# Patient Record
Sex: Female | Born: 1985 | Hispanic: Yes | Marital: Married | Smoking: Never smoker
Health system: Southern US, Community
[De-identification: ages and names within clinical notes are randomized; demographics above are authoritative.]

## PROBLEM LIST (undated history)

## (undated) DIAGNOSIS — D649 Anemia, unspecified: Secondary | ICD-10-CM

---

## 2008-06-16 ENCOUNTER — Ambulatory Visit: Payer: Self-pay | Admitting: Family Medicine

## 2008-11-09 ENCOUNTER — Ambulatory Visit: Payer: Self-pay | Admitting: Certified Nurse Midwife

## 2008-11-10 ENCOUNTER — Inpatient Hospital Stay: Payer: Self-pay | Admitting: Obstetrics and Gynecology

## 2014-10-13 ENCOUNTER — Emergency Department: Payer: Self-pay | Admitting: Emergency Medicine

## 2014-10-13 LAB — CBC
HCT: 36.4 % (ref 35.0–47.0)
HGB: 11.3 g/dL — ABNORMAL LOW (ref 12.0–16.0)
MCH: 26.2 pg (ref 26.0–34.0)
MCHC: 31.2 g/dL — AB (ref 32.0–36.0)
MCV: 84 fL (ref 80–100)
Platelet: 338 10*3/uL (ref 150–440)
RBC: 4.34 10*6/uL (ref 3.80–5.20)
RDW: 16.6 % — AB (ref 11.5–14.5)
WBC: 9.1 10*3/uL (ref 3.6–11.0)

## 2014-10-13 LAB — HCG, QUANTITATIVE, PREGNANCY: Beta Hcg, Quant.: 1839 m[IU]/mL — ABNORMAL HIGH

## 2015-03-14 LAB — SURGICAL PATHOLOGY

## 2015-07-13 ENCOUNTER — Other Ambulatory Visit: Payer: Self-pay | Admitting: Primary Care

## 2015-07-13 DIAGNOSIS — Z3492 Encounter for supervision of normal pregnancy, unspecified, second trimester: Secondary | ICD-10-CM

## 2015-07-13 LAB — OB RESULTS CONSOLE ABO/RH: RH TYPE: POSITIVE

## 2015-07-13 LAB — OB RESULTS CONSOLE GC/CHLAMYDIA
CHLAMYDIA, DNA PROBE: NEGATIVE
GC PROBE AMP, GENITAL: NEGATIVE

## 2015-07-13 LAB — OB RESULTS CONSOLE RUBELLA ANTIBODY, IGM: Rubella: IMMUNE

## 2015-07-13 LAB — OB RESULTS CONSOLE HIV ANTIBODY (ROUTINE TESTING): HIV: NONREACTIVE

## 2015-07-13 LAB — OB RESULTS CONSOLE ANTIBODY SCREEN: ANTIBODY SCREEN: NEGATIVE

## 2015-07-13 LAB — OB RESULTS CONSOLE HEPATITIS B SURFACE ANTIGEN: Hepatitis B Surface Ag: NEGATIVE

## 2015-07-13 LAB — OB RESULTS CONSOLE VARICELLA ZOSTER ANTIBODY, IGG: Varicella: IMMUNE

## 2015-07-13 LAB — OB RESULTS CONSOLE RPR: RPR: NONREACTIVE

## 2015-08-16 ENCOUNTER — Ambulatory Visit
Admission: RE | Admit: 2015-08-16 | Discharge: 2015-08-16 | Disposition: A | Discharge status: Home / Self Care | Payer: Medicaid Other | Source: Ambulatory Visit | Attending: Primary Care | Admitting: Primary Care

## 2015-08-16 DIAGNOSIS — Z36 Encounter for antenatal screening of mother: Secondary | ICD-10-CM | POA: Insufficient documentation

## 2015-08-16 DIAGNOSIS — Z3492 Encounter for supervision of normal pregnancy, unspecified, second trimester: Secondary | ICD-10-CM

## 2015-11-20 NOTE — L&D Delivery Note (Signed)
Delivery Note At 4:56 PM a viable  female sex was delivered via Vaginal, Spontaneous Delivery (Presentation: ; LOA ) loose nuchal cord .  APGAR: 8, 9; weight  .   Placenta status: , Spontaneous.  Cord: 3 vessels with the following complications:None . Delayed cord clamp for 60 sec .  Marland Kitchen   Anesthesia: None  Episiotomy: None Lacerations: None Suture Repair: n/a Est. Blood Loss (mL):    Mom to postpartum.  Baby to Couplet care / Skin to Skin.  Esha Fincher 01/14/2016, 5:11 PM

## 2015-12-15 LAB — OB RESULTS CONSOLE GBS: STREP GROUP B AG: NEGATIVE

## 2016-01-12 ENCOUNTER — Other Ambulatory Visit: Payer: Self-pay | Admitting: Obstetrics and Gynecology

## 2016-01-12 DIAGNOSIS — O480 Post-term pregnancy: Secondary | ICD-10-CM

## 2016-01-12 NOTE — H&P (Signed)
HISTORY AND PHYSICAL  HISTORY OF PRESENT ILLNESS: Ms. Rhonda Berry is a 30 y.o. G1P0 with LMP of 04/02/16 & EDD of 2/19/17consistent with 20 3/7 week ultrasound with a pregnancy complicated by Overweight status, anemia (Fe def) and previous spont complete AB  presenting for induction of labor for post-dates at 41 weeks. PNC at [facility] with records available.   She has bit been having contractions and denies leakage of fluid, vaginal bleeding, or decreased fetal movement.    REVIEW OF SYSTEMS: A complete review of systems was performed and was specifically negative for headache, changes in vision, RUQ pain, shortness of breath, chest pain, lower extremity edema and dysuria.   HISTORY:  No past medical history on file.  No past surgical history on file.  No current outpatient prescriptions on file prior to visit.   No current facility-administered medications on file prior to visit.     Allergies not on file  OB: G2P0010  Gynecologic History: History of Abnormal Pap Smear: neg 04/27/14 History of STI: none  Social: No ETOH, No MJ or drug history  PHYSICAL EXAM: @  GENERAL: NAD AAOx3 CHEST:CTAB no increased work of breathing CV:RRR no appreciable murmurs, rubs, gallops ABDOMEN: gravid, nontender, EFW g by [place] EXTREMITIES:  Warm and well-perfused, nontender, nonedematous,  DTRs clonus CERVIX: SPECULUM:   FHT:s baseline with  variabilityaccelerations and decelerations  Toco: H&P in advance  DIAGNOSTIC STUDIES: No results for input(s): WBC, HGB, HCT, PLT, NA, K, CL, CO2, BUN, CREATININE, LABGLOM, GLUCOSE, CALCIUM, BILIDIR, ALKPHOS, AST, ALT, PROT, MG in the last 168 hours.  Invalid input(s): LABALB, UA  PRENATAL STUDIES:  PrenO pos Rubella immune, Varicella immune, HIV not found, RPR neg, Hep B neg, GC/CT neg, GBS , glucola 121  Last [place] normal anatomy  ASSESSMENT AND PLAN:  1. Fetal well being reassured 2. IOL scheduled (see Dr Schermerhorn's H&P) P: FU  as scheduled.

## 2016-01-14 ENCOUNTER — Inpatient Hospital Stay
Admission: EM | Admit: 2016-01-14 | Discharge: 2016-01-15 | DRG: 775 | Disposition: A | Discharge status: Home / Self Care | Payer: Medicaid Other | Attending: Obstetrics and Gynecology | Admitting: Obstetrics and Gynecology

## 2016-01-14 DIAGNOSIS — Z3A40 40 weeks gestation of pregnancy: Secondary | ICD-10-CM | POA: Diagnosis not present

## 2016-01-14 DIAGNOSIS — IMO0001 Reserved for inherently not codable concepts without codable children: Secondary | ICD-10-CM

## 2016-01-14 DIAGNOSIS — O480 Post-term pregnancy: Principal | ICD-10-CM | POA: Diagnosis present

## 2016-01-14 HISTORY — DX: Anemia, unspecified: D64.9

## 2016-01-14 LAB — CBC
HCT: 37.3 % (ref 35.0–47.0)
HEMOGLOBIN: 12.8 g/dL (ref 12.0–16.0)
MCH: 31.8 pg (ref 26.0–34.0)
MCHC: 34.4 g/dL (ref 32.0–36.0)
MCV: 92.7 fL (ref 80.0–100.0)
PLATELETS: 271 10*3/uL (ref 150–440)
RBC: 4.02 MIL/uL (ref 3.80–5.20)
RDW: 12.4 % (ref 11.5–14.5)
WBC: 14.5 10*3/uL — ABNORMAL HIGH (ref 3.6–11.0)

## 2016-01-14 LAB — ABO/RH: ABO/RH(D): O POS

## 2016-01-14 LAB — TYPE AND SCREEN
ABO/RH(D): O POS
ANTIBODY SCREEN: NEGATIVE

## 2016-01-14 MED ORDER — OXYCODONE-ACETAMINOPHEN 5-325 MG PO TABS: 2.0000 | ORAL_TABLET | ORAL | Status: DC | PRN

## 2016-01-14 MED ORDER — IBUPROFEN 600 MG PO TABS
600.0000 mg | ORAL_TABLET | Freq: Four times a day (QID) | ORAL | Status: DC
  Administered 2016-01-14 – 2016-01-15 (×4): 600 mg via ORAL
  Filled 2016-01-14 (×4): qty 1

## 2016-01-14 MED ORDER — WITCH HAZEL-GLYCERIN EX PADS: 1.0000 "application " | MEDICATED_PAD | CUTANEOUS | Status: DC | PRN

## 2016-01-14 MED ORDER — MISOPROSTOL 200 MCG PO TABS
ORAL_TABLET | ORAL | Status: AC
  Filled 2016-01-14: qty 4

## 2016-01-14 MED ORDER — LACTATED RINGERS IV SOLN: 500.0000 mL | INTRAVENOUS | Status: DC | PRN

## 2016-01-14 MED ORDER — OXYTOCIN 40 UNITS IN LACTATED RINGERS INFUSION - SIMPLE MED
2.5000 [IU]/h | INTRAVENOUS | Status: DC
  Administered 2016-01-14: 2.5 [IU]/h via INTRAVENOUS

## 2016-01-14 MED ORDER — LANOLIN HYDROUS EX OINT: TOPICAL_OINTMENT | CUTANEOUS | Status: DC | PRN

## 2016-01-14 MED ORDER — MEASLES, MUMPS & RUBELLA VAC ~~LOC~~ INJ: 0.5000 mL | INJECTION | Freq: Once | SUBCUTANEOUS | Status: DC

## 2016-01-14 MED ORDER — OXYTOCIN 40 UNITS IN LACTATED RINGERS INFUSION - SIMPLE MED
INTRAVENOUS | Status: AC
  Filled 2016-01-14: qty 1000

## 2016-01-14 MED ORDER — ONDANSETRON HCL 4 MG/2ML IJ SOLN: 4.0000 mg | Freq: Four times a day (QID) | INTRAMUSCULAR | Status: DC | PRN

## 2016-01-14 MED ORDER — DIPHENHYDRAMINE HCL 25 MG PO CAPS: 25.0000 mg | ORAL_CAPSULE | Freq: Four times a day (QID) | ORAL | Status: DC | PRN

## 2016-01-14 MED ORDER — BUTORPHANOL TARTRATE 1 MG/ML IJ SOLN: 1.0000 mg | INTRAMUSCULAR | Status: DC | PRN

## 2016-01-14 MED ORDER — LIDOCAINE HCL (PF) 1 % IJ SOLN: 30.0000 mL | INTRAMUSCULAR | Status: DC | PRN

## 2016-01-14 MED ORDER — OXYTOCIN BOLUS FROM INFUSION
500.0000 mL | INTRAVENOUS | Status: DC
  Administered 2016-01-14: 500 mL via INTRAVENOUS

## 2016-01-14 MED ORDER — OXYTOCIN 10 UNIT/ML IJ SOLN
INTRAMUSCULAR | Status: AC
  Filled 2016-01-14: qty 2

## 2016-01-14 MED ORDER — LACTATED RINGERS IV SOLN
INTRAVENOUS | Status: DC
  Administered 2016-01-14: 17:00:00 via INTRAVENOUS

## 2016-01-14 MED ORDER — CITRIC ACID-SODIUM CITRATE 334-500 MG/5ML PO SOLN: 30.0000 mL | ORAL | Status: DC | PRN

## 2016-01-14 MED ORDER — FERROUS SULFATE 325 (65 FE) MG PO TABS
325.0000 mg | ORAL_TABLET | Freq: Two times a day (BID) | ORAL | Status: DC
  Administered 2016-01-15 (×2): 325 mg via ORAL
  Filled 2016-01-14 (×2): qty 1

## 2016-01-14 MED ORDER — AMMONIA AROMATIC IN INHA
RESPIRATORY_TRACT | Status: AC
  Filled 2016-01-14: qty 10

## 2016-01-14 MED ORDER — DIBUCAINE 1 % RE OINT: 1.0000 "application " | TOPICAL_OINTMENT | RECTAL | Status: DC | PRN

## 2016-01-14 MED ORDER — OXYCODONE-ACETAMINOPHEN 5-325 MG PO TABS: 1.0000 | ORAL_TABLET | ORAL | Status: DC | PRN

## 2016-01-14 MED ORDER — SENNOSIDES-DOCUSATE SODIUM 8.6-50 MG PO TABS
2.0000 | ORAL_TABLET | ORAL | Status: DC
  Administered 2016-01-14: 2 via ORAL
  Filled 2016-01-14: qty 2

## 2016-01-14 MED ORDER — ONDANSETRON HCL 4 MG PO TABS: 4.0000 mg | ORAL_TABLET | ORAL | Status: DC | PRN

## 2016-01-14 MED ORDER — MAGNESIUM HYDROXIDE 400 MG/5ML PO SUSP: 30.0000 mL | ORAL | Status: DC | PRN

## 2016-01-14 MED ORDER — LIDOCAINE HCL (PF) 1 % IJ SOLN
INTRAMUSCULAR | Status: AC
  Filled 2016-01-14: qty 30

## 2016-01-14 MED ORDER — BENZOCAINE-MENTHOL 20-0.5 % EX AERO: 1.0000 "application " | INHALATION_SPRAY | CUTANEOUS | Status: DC | PRN

## 2016-01-14 MED ORDER — ONDANSETRON HCL 4 MG/2ML IJ SOLN: 4.0000 mg | INTRAMUSCULAR | Status: DC | PRN

## 2016-01-14 MED ORDER — ACETAMINOPHEN 325 MG PO TABS: 650.0000 mg | ORAL_TABLET | ORAL | Status: DC | PRN

## 2016-01-14 MED ORDER — SIMETHICONE 80 MG PO CHEW: 80.0000 mg | CHEWABLE_TABLET | ORAL | Status: DC | PRN

## 2016-01-14 MED ORDER — ZOLPIDEM TARTRATE 5 MG PO TABS: 5.0000 mg | ORAL_TABLET | Freq: Every evening | ORAL | Status: DC | PRN

## 2016-01-14 MED ORDER — ACETAMINOPHEN 325 MG PO TABS
650.0000 mg | ORAL_TABLET | ORAL | Status: DC | PRN
Start: 2016-01-14 — End: 2016-01-16

## 2016-01-14 MED ORDER — PRENATAL MULTIVITAMIN CH
1.0000 | ORAL_TABLET | Freq: Every day | ORAL | Status: DC
  Administered 2016-01-15: 1 via ORAL
  Filled 2016-01-14: qty 1

## 2016-01-14 NOTE — H&P (Signed)
Rhonda Berry is a 30 y.o. female presenting for active labor . Cervix is 9 cm  . G3P1 at 40+6 weeks  History OB History    Gravida Para Term Preterm AB TAB SAB Ectopic Multiple Living   0 1 0 1 0 0 2     No past medical history on file.anemia  No past surgical history on file. Family History: family history is not on file. Social History:  has no tobacco, alcohol, and drug history on file.   Prenatal Transfer Tool  Maternal Diabetes: No Genetic Screening: Declined Maternal Ultrasounds/Referrals: Normal Fetal Ultrasounds or other Referrals:  None Maternal Substance Abuse:  No Significant Maternal Medications:  None Significant Maternal Lab Results:  None Other Comments:  None  ROS  Dilation: 9 Station: 0, +1 Exam by:: JTC Blood pressure 137/84, pulse 77, temperature 99.2 F (37.3 C), temperature source Oral, resp. rate 16, last menstrual period 04/03/2015, unknown if currently breastfeeding. Exam Physical Exam  Prenatal labs: ABO, Rh: O/Positive/-- (08/24 0000) Antibody: Negative (08/24 0000) Rubella: Immune (08/24 0000) RPR: Nonreactive (08/24 0000)  HBsAg: Negative (08/24 0000)  HIV: Non-reactive (08/24 0000)  GBS: Negative (01/26 0000)   Assessment/Plan: Advanced cervical dilation . reassuring fetal monitoring    Rhonda Berry 01/14/2016, 5:13 PM

## 2016-01-15 LAB — CBC
HEMATOCRIT: 37.6 % (ref 35.0–47.0)
HEMOGLOBIN: 12.9 g/dL (ref 12.0–16.0)
MCH: 31.9 pg (ref 26.0–34.0)
MCHC: 34.3 g/dL (ref 32.0–36.0)
MCV: 92.9 fL (ref 80.0–100.0)
PLATELETS: 280 10*3/uL (ref 150–440)
RBC: 4.05 MIL/uL (ref 3.80–5.20)
RDW: 12.8 % (ref 11.5–14.5)
WBC: 15.7 10*3/uL — ABNORMAL HIGH (ref 3.6–11.0)

## 2016-01-15 MED ORDER — IBUPROFEN 600 MG PO TABS: 600.0000 mg | ORAL_TABLET | Freq: Four times a day (QID) | ORAL | Status: AC

## 2016-01-15 NOTE — Progress Notes (Signed)
Reviewed D/C instructions with patient and FOB with Rhonda Berry, Interpreter.  Reviewed when to call the MD, f/u appointment instructions, medications/prescriptions, and activity restrictions.  Pt & FOB expressed understanding and had no questions.  Discharged home via wheelchair by nursing staff.

## 2016-01-15 NOTE — Discharge Summary (Signed)
Obstetric Discharge Summary Reason for Admission: onset of labor Prenatal Procedures: none Intrapartum Procedures: spontaneous vaginal delivery Postpartum Procedures: none Complications-Operative and Postpartum: none HEMOGLOBIN  Date Value Ref Range Status  01/15/2016 12.9 12.0 - 16.0 g/dL Final   HGB  Date Value Ref Range Status  10/13/2014 11.3* 12.0-16.0 g/dL Final   HCT  Date Value Ref Range Status  01/15/2016 37.6 35.0 - 47.0 % Final  10/13/2014 36.4 35.0-47.0 % Final    Physical Exam:  General: alert and cooperative Lochia: appropriate Uterine Fundus: firm Incision: n/a DVT Evaluation: No evidence of DVT seen on physical exam.  Discharge Diagnoses: Term Pregnancy-delivered  Discharge Information: Date: 01/15/2016 Activity: pelvic rest Diet: routine Medications: Ibuprofen Condition: stable Instructions: refer to practice specific booklet Discharge to: home Follow-up Information    Follow up with [facility] In 6 weeks.   Why:  postpartum care   Contact information:   [address] [place] [address] [phone]      Patient is interested in [employer] Newborn Data: Live born female  Birth Weight: 7 lb 1.6 oz (3220 g) APGAR: 8, 9  Home with mother.  SCHERMERHORN,THOMAS 01/15/2016, 2:37 PM

## 2016-01-16 LAB — RPR: RPR Ser Ql: NONREACTIVE

## 2016-01-27 IMAGING — US US OB < 14 WEEKS - US OB TV
1 series · 14 of 28 positions shown · non-contrast
Comparison: None.

CLINICAL DATA: Vaginal bleeding for 3 days. Quantitative HCG [DATE].

EXAM:
OBSTETRIC <14 WK US AND TRANSVAGINAL OB US
TECHNIQUE: Both transabdominal and transvaginal ultrasound examinations were
performed for complete evaluation of the gestation as well as the
maternal uterus, adnexal regions, and pelvic cul-de-sac.
Transvaginal technique was performed to assess early pregnancy.

[Series 1: us ob < 14 weeks - us ob tv · 0.21mm/px · 14 of 72 slices shown]
[im 3/72]
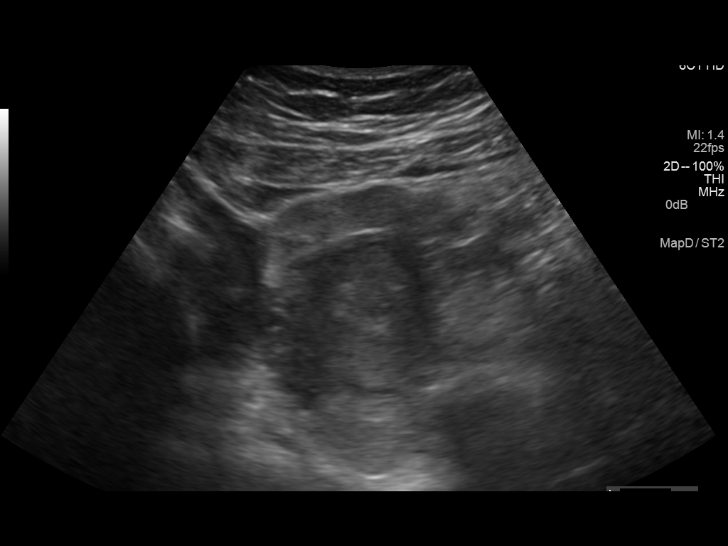
[im 8/72]
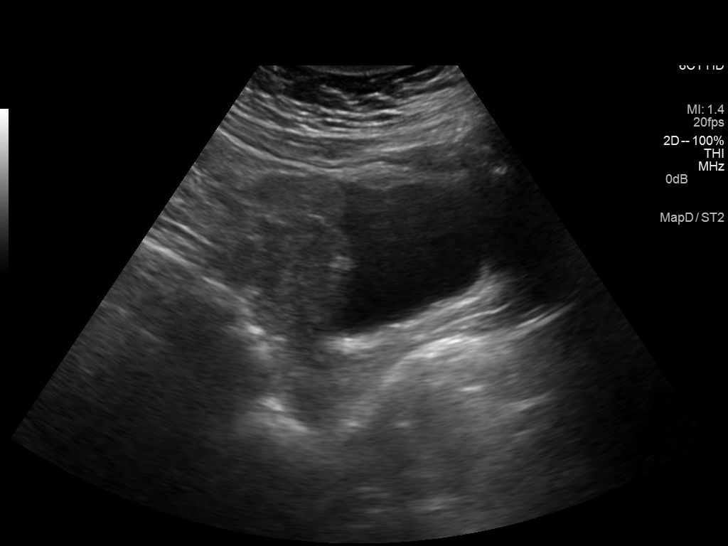
[im 14/72]
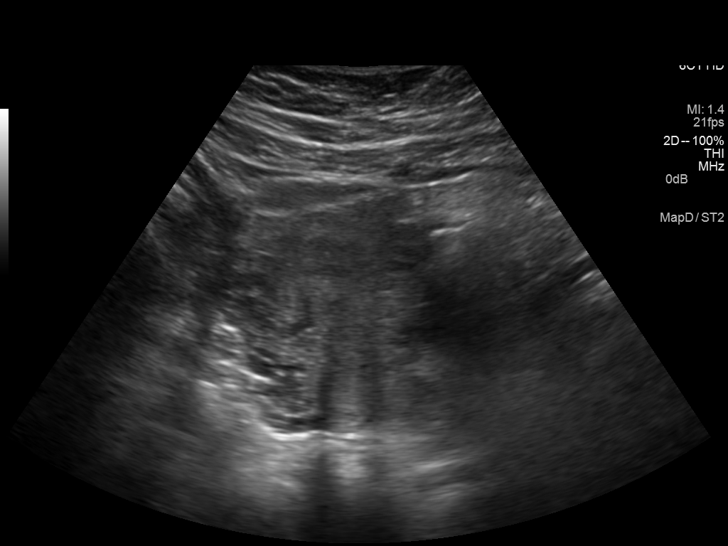
[im 19/72]
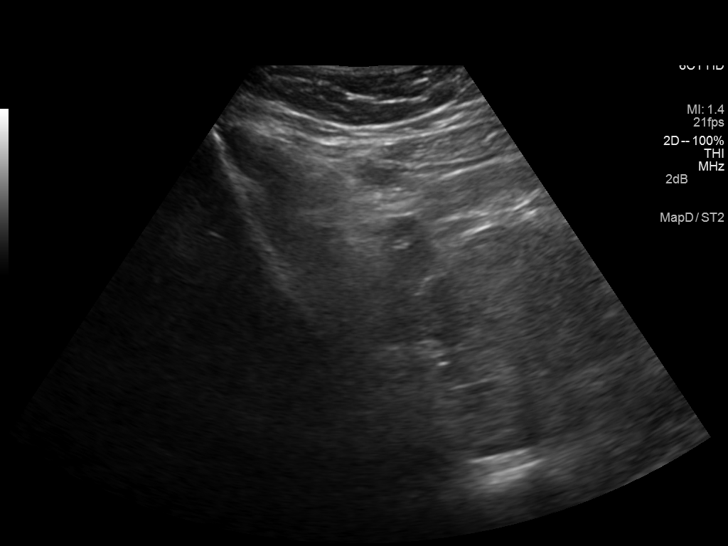
[im 24/72]
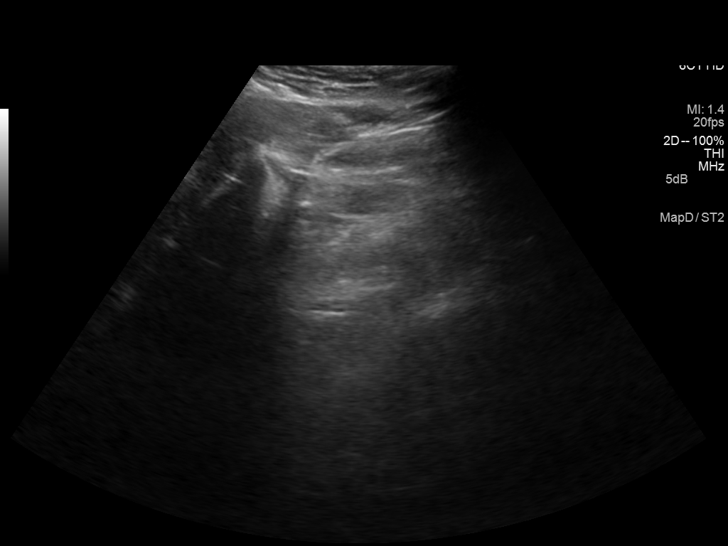
[im 29/72]
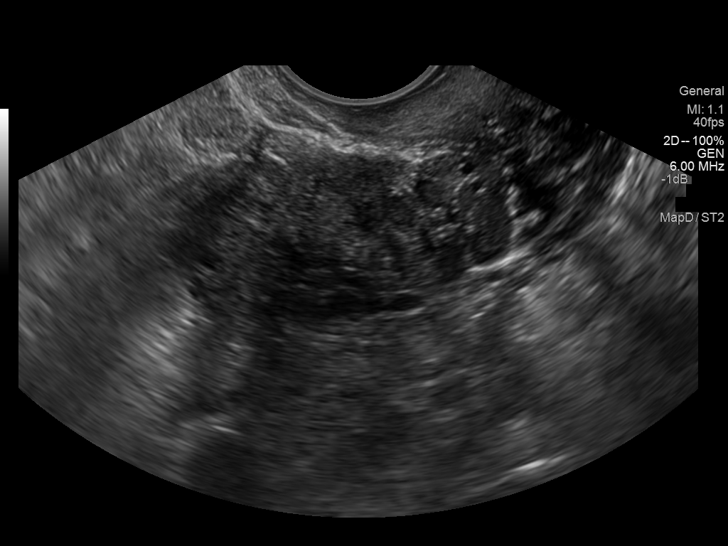
[im 35/72]
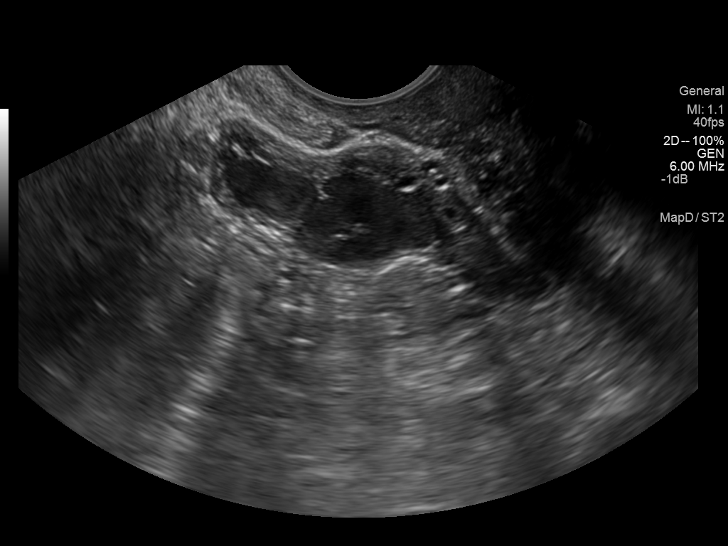
[im 40/72]
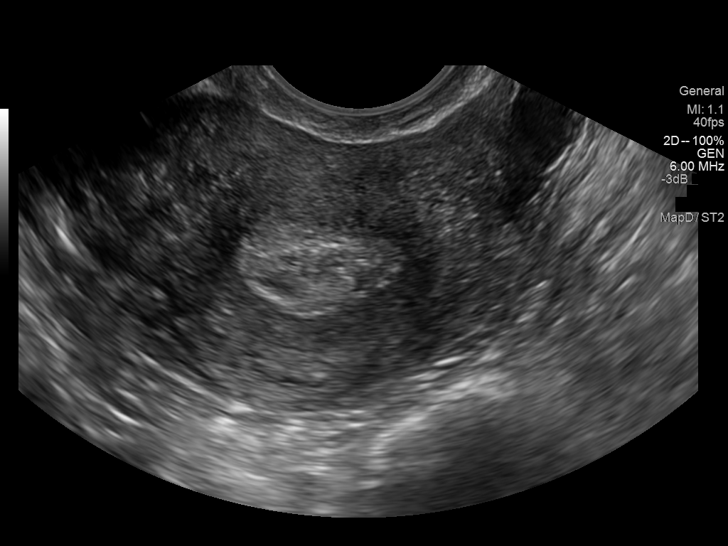
[im 45/72]
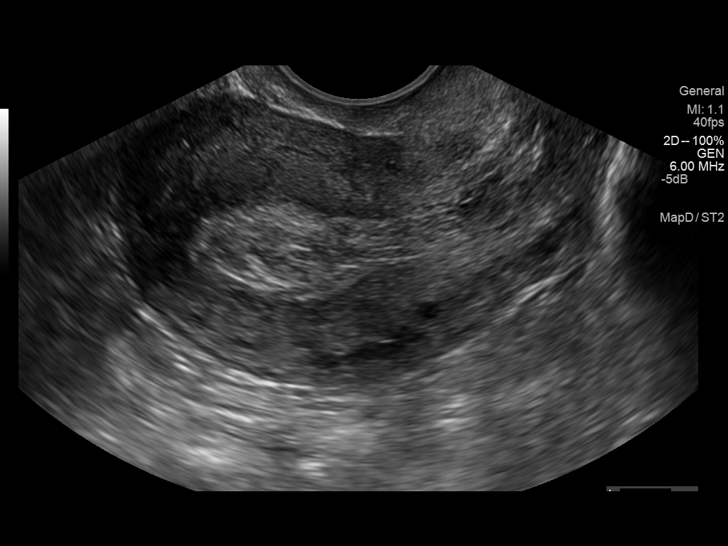
[im 50/72]
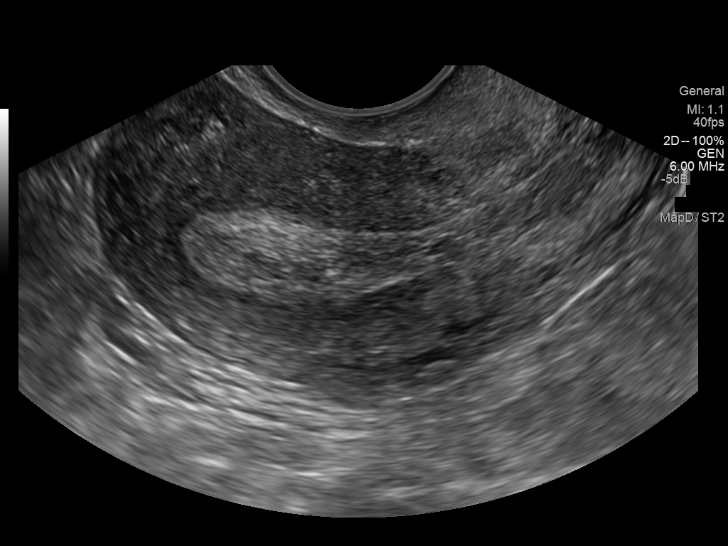
[im 56/72]
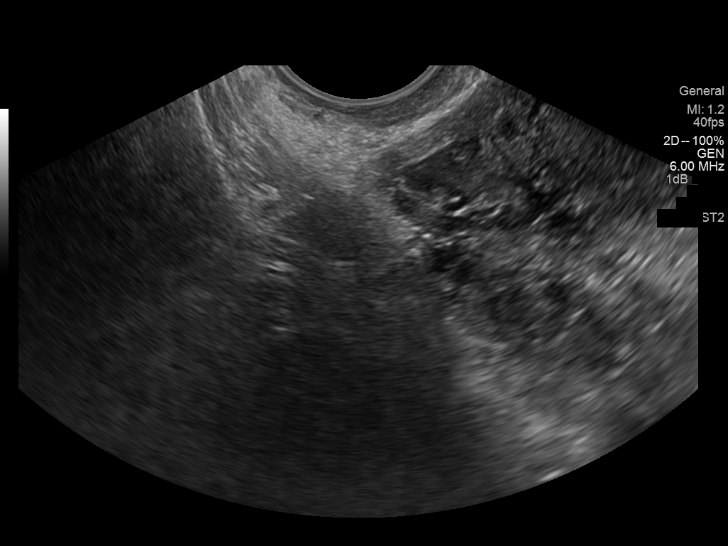
[im 61/72]
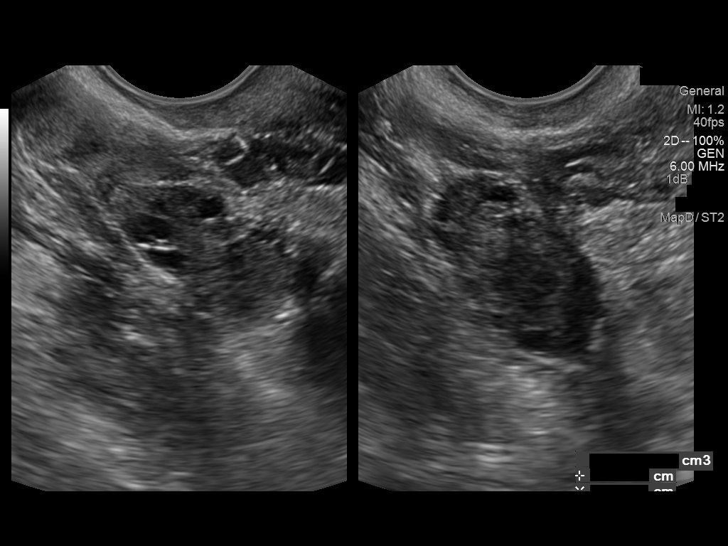
[im 66/72]
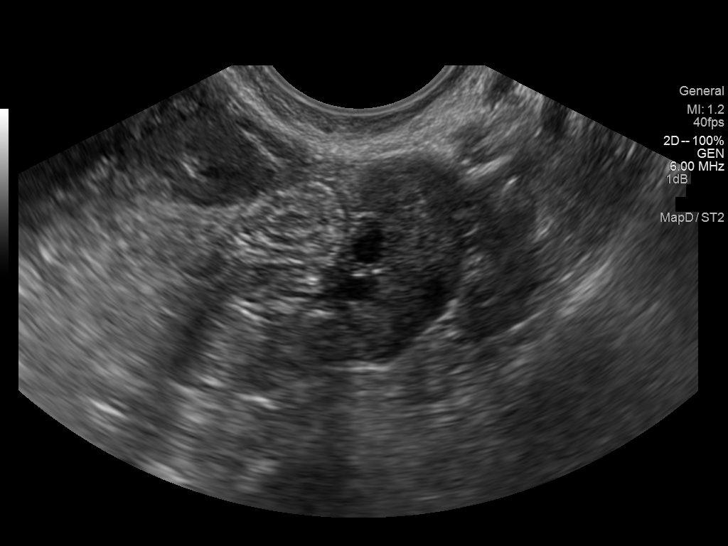
[im 72/72]
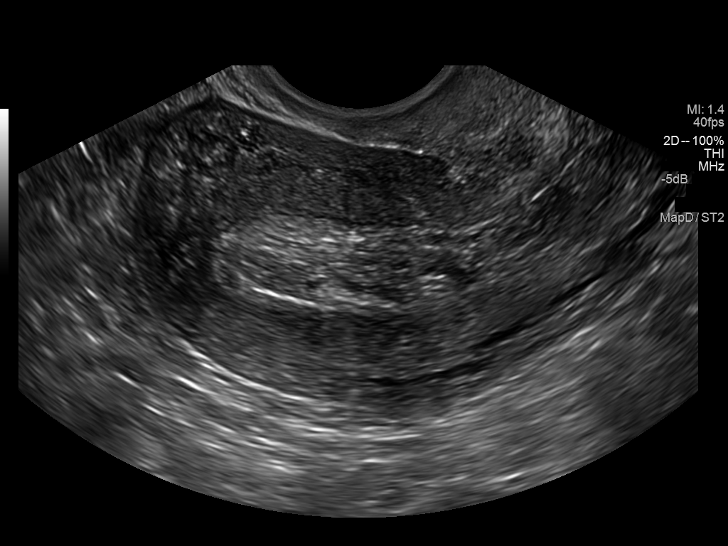

[14 of 28 positions shown; findings below may reference images not displayed]

FINDINGS: Intrauterine gestational sac: Not visualized. Endometrial thickness
is 1.3 cm, within normal limits.

Yolk sac:  Not visualized.

Embryo:  Not visualized.

Cardiac Activity: Not applicable.

Maternal uterus/adnexae: Unremarkable. No free pelvic fluid is
identified.
IMPRESSION: No evidence of intrauterine or ectopic pregnancy is identified.
Finding could be due to early gestational age or completed abortion.
Recommend followup quantitative HCG.

## 2016-10-31 IMAGING — US US OB COMP +14 WK
1 series · 13 of 28 positions shown · non-contrast
Comparison: none

CLINICAL DATA: Scan for anatomy. Gravida 3 para 1. 19 weeks 2 days
by LMP.

EXAM:
OBSTETRIC 14+ WK ULTRASOUND

[Series 1: us ob comp +14 wk · 0.21mm/px · 13 of 82 slices shown]
[im 4/82]
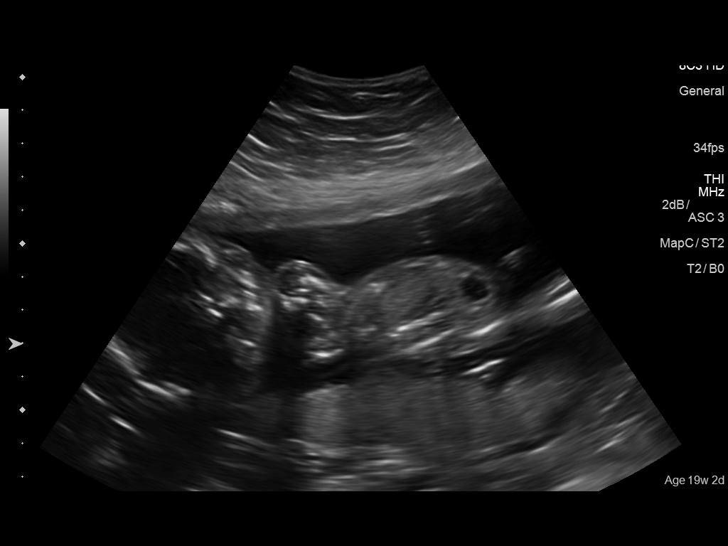
[im 10/82]
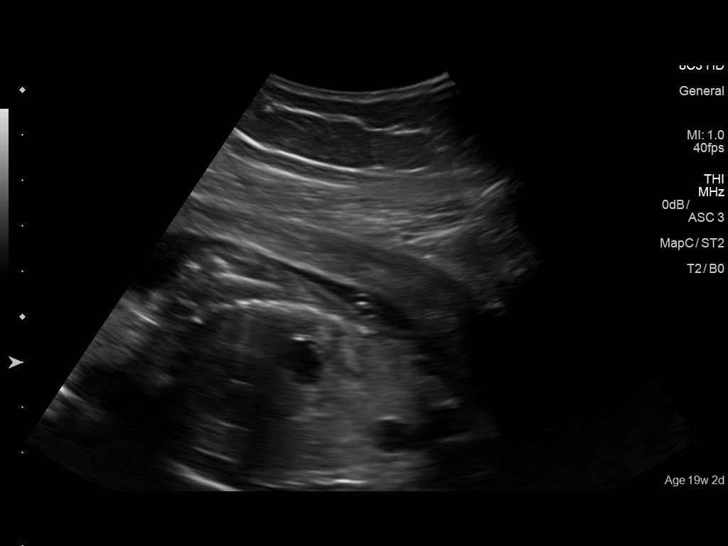
[im 16/82]
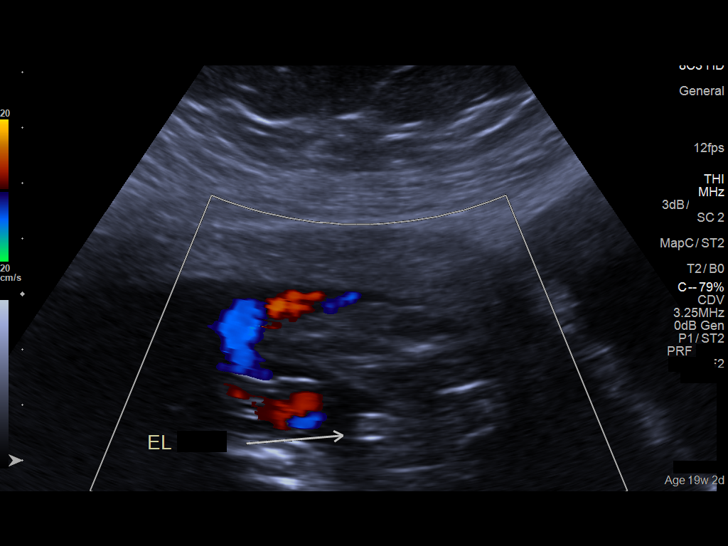
[im 22/82]
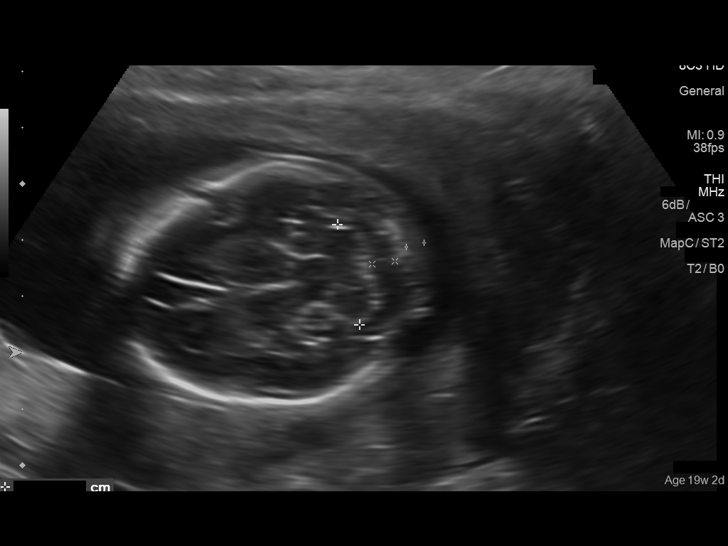
[im 28/82]
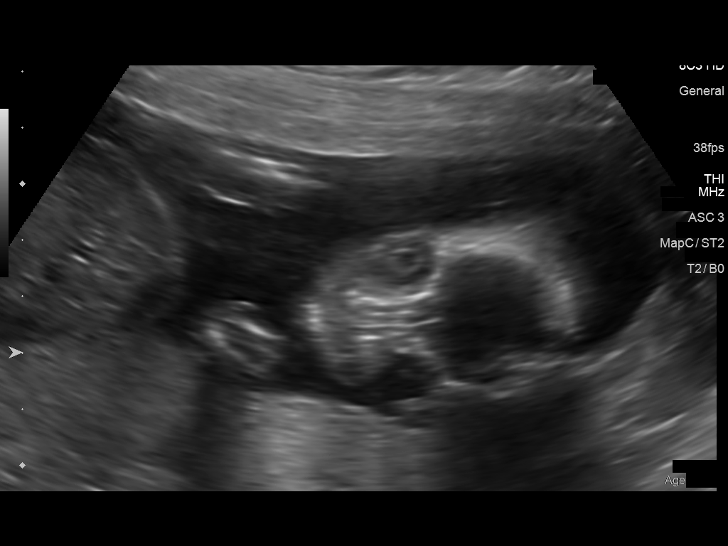
[im 34/82]
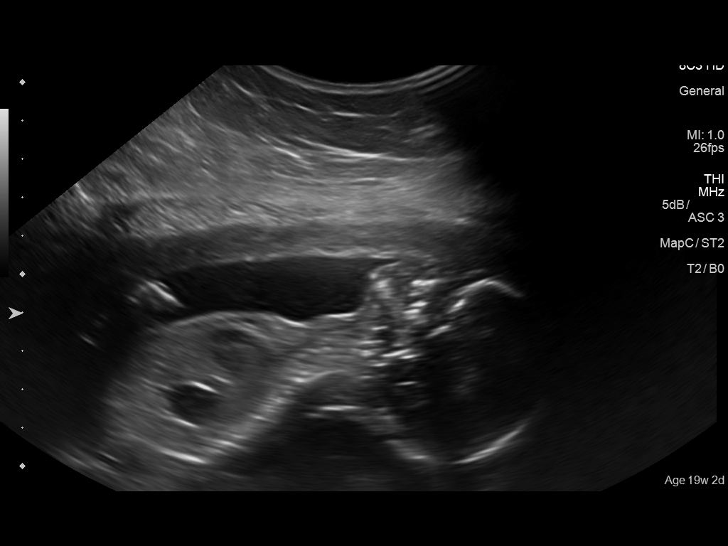
[im 43/82]
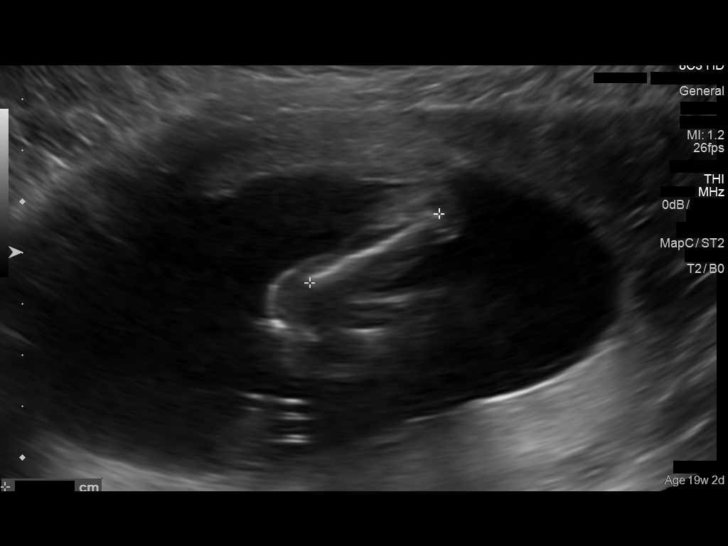
[im 49/82]
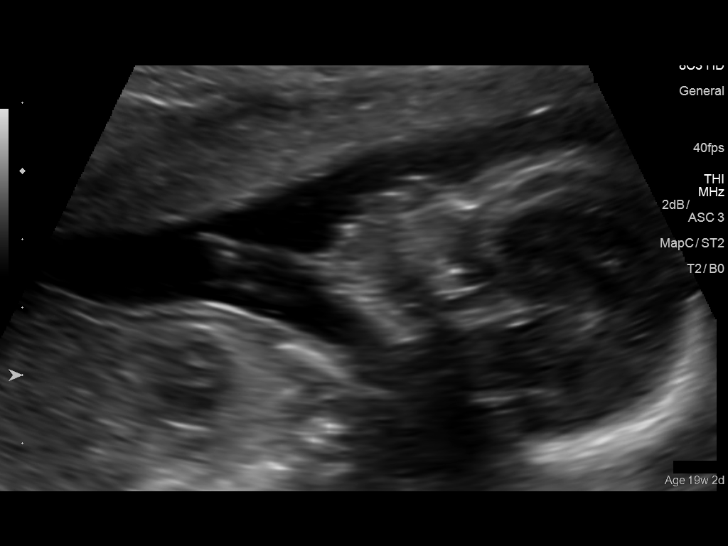
[im 55/82]
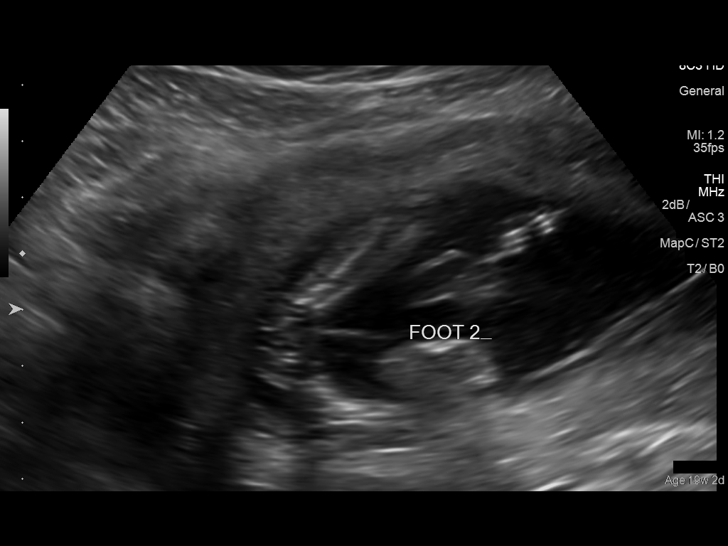
[im 61/82]
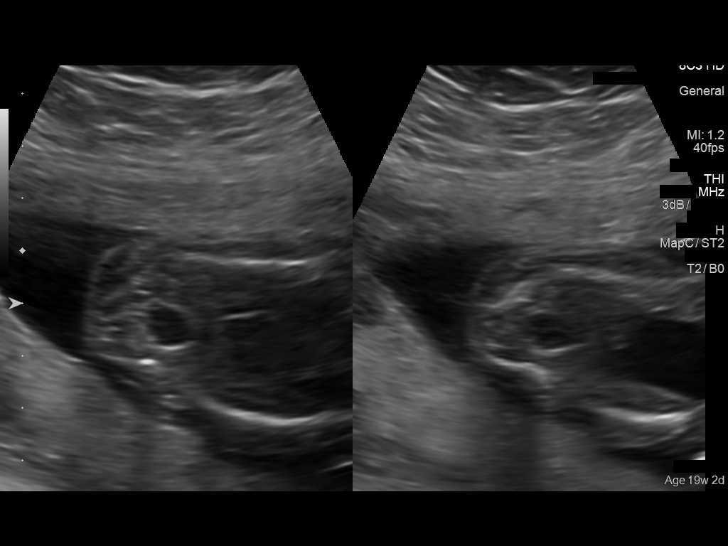
[im 67/82]
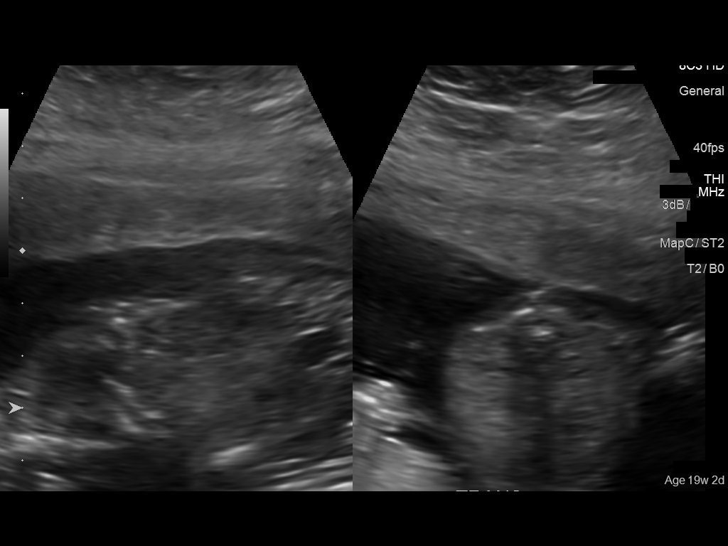
[im 73/82]
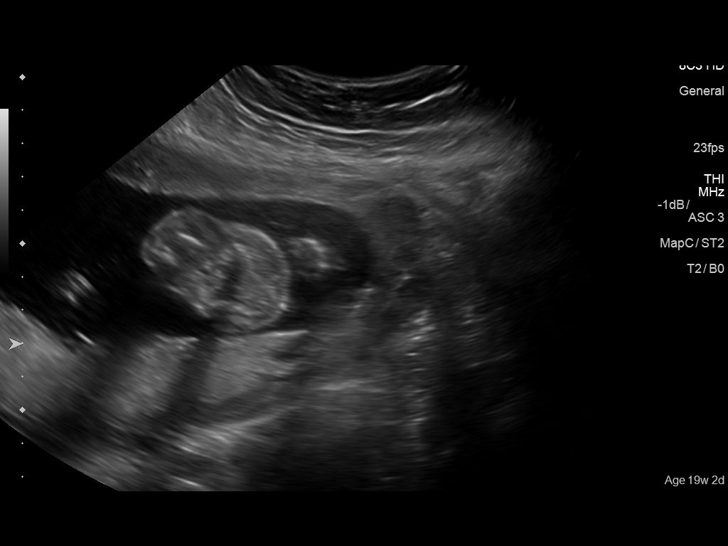
[im 79/82]
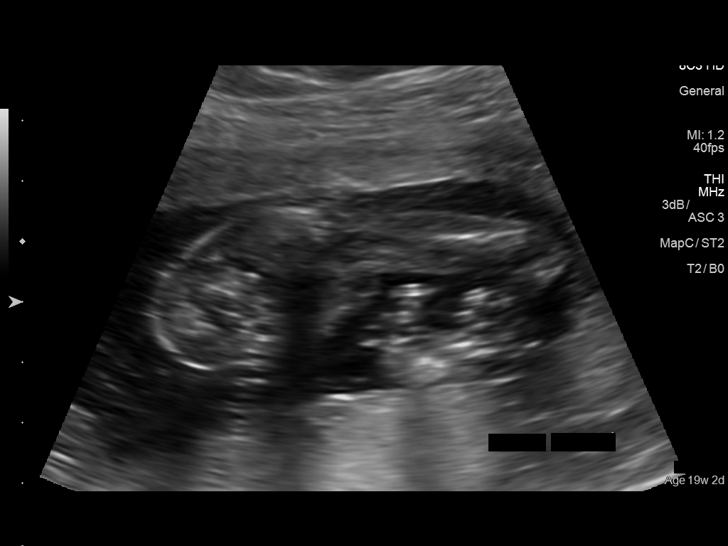

[13 of 28 positions shown; findings below may reference images not displayed]

FINDINGS: Number of Fetuses: 1

Heart Rate:  144 bpm

Movement: Yes

Presentation: Variable

Previa: None

Placental Location:  Posterior, greater than 2 cm from internal os

Amniotic Fluid (Subjective): Normal

Amniotic Fluid (Objective):

Vertical pocket 4.0cm

FETAL BIOMETRY

BPD:  4.3cm 19w 0d

HC:    15.8cm  18w   5d

AC:   14.1cm  19w   3d

FL:   2.8cm  18w   5d

Current Mean GA: 19w 0d              US EDC: 01/10/16

FETAL ANATOMY

Lateral Ventricles:  Appear normal

Thalami/CSP:  Appear normal

Posterior Fossa:  Appears normal

Nuchal Region:  Appears normal    NFT= 3.2mm

Upper Lip:  Appears normal

Spine:  Appears normal

4 Chamber Heart on Left:  Appears normal

LVOT:  Appears normal

RVOT:  Appears normal

Stomach on Left:  Appears normal

3 Vessel Cord:  Appears normal

Cord Insertion site:  Appears normal

Kidneys:  Appear normal

Bladder:  Appears normal

Extremities:  Appear normal

Sex:  Male genitalia

Technically difficult due to:  Maternal habitus

Maternal Findings:

Cervix:  4.1 cm transabdominally
IMPRESSION: Single living intrauterine fetus in variable presentation.

Posterior placenta.  No previa or low lying position.

Size and dates correlate well.

No fetal anomalies identified.

## 2020-02-15 ENCOUNTER — Ambulatory Visit: Payer: Self-pay | Attending: Internal Medicine

## 2020-02-15 DIAGNOSIS — Z23 Encounter for immunization: Secondary | ICD-10-CM

## 2020-02-15 NOTE — Progress Notes (Signed)
   Covid-19 Vaccination Clinic  Name:  Kameko Hukill    MRN: [id number] DOB: January 31, 1986  02/15/2020  Ms. Matute Evelene Croon was observed post Covid-19 immunization for 15 minutes without incident. She was provided with Vaccine Information Sheet and instruction to access the V-Safe system.   Ms. Dodie Parisi was instructed to call 911 with any severe reactions post vaccine: Marland Kitchen Difficulty breathing  . Swelling of face and throat  . A fast heartbeat  . A bad rash all over body  . Dizziness and weakness   Immunizations Administered    Name Date Dose VIS Date Route   Pfizer COVID-19 Vaccine 02/15/2020 12:40 PM 0.3 mL 10/30/2019 Intramuscular   Manufacturer: [employer], [employer]   Lot: [id number]   NDC: [address]-0

## 2020-03-07 ENCOUNTER — Ambulatory Visit: Payer: Self-pay | Attending: Internal Medicine

## 2020-03-07 DIAGNOSIS — Z23 Encounter for immunization: Secondary | ICD-10-CM

## 2020-03-07 NOTE — Progress Notes (Signed)
   Covid-19 Vaccination Clinic  Name:  Aarian Cleaver    MRN: [id number] DOB: 01-Jan-1986  03/07/2020  Ms. Matute Evelene Croon was observed post Covid-19 immunization for 15 minutes without incident. She was provided with Vaccine Information Sheet and instruction to access the V-Safe system.   Ms. Mykela Mewborn was instructed to call 911 with any severe reactions post vaccine: Marland Kitchen Difficulty breathing  . Swelling of face and throat  . A fast heartbeat  . A bad rash all over body  . Dizziness and weakness   Immunizations Administered    Name Date Dose VIS Date Route   Pfizer COVID-19 Vaccine 03/07/2020 10:43 AM 0.3 mL 01/13/2019 Intramuscular   Manufacturer: [employer], [employer]   Lot: [id number]   NDC: [address]-5

## 2022-02-11 ENCOUNTER — Other Ambulatory Visit: Payer: Self-pay

## 2022-02-11 ENCOUNTER — Emergency Department
Admission: EM | Admit: 2022-02-11 | Discharge: 2022-02-11 | Disposition: A | Discharge status: Home / Self Care | Payer: Self-pay | Attending: Emergency Medicine | Admitting: Emergency Medicine

## 2022-02-11 DIAGNOSIS — Z20822 Contact with and (suspected) exposure to covid-19: Secondary | ICD-10-CM | POA: Insufficient documentation

## 2022-02-11 DIAGNOSIS — B349 Viral infection, unspecified: Secondary | ICD-10-CM | POA: Insufficient documentation

## 2022-02-11 DIAGNOSIS — R55 Syncope and collapse: Secondary | ICD-10-CM | POA: Insufficient documentation

## 2022-02-11 LAB — TROPONIN I (HIGH SENSITIVITY): Troponin I (High Sensitivity): <2 ng/L (ref <18)

## 2022-02-11 LAB — COMPREHENSIVE METABOLIC PANEL
ALT: 15 U/L (ref 0–44)
AST: 18 U/L (ref 15–41)
Albumin: 4.5 g/dL (ref 3.5–5.0)
Alkaline Phosphatase: 58 U/L (ref 38–126)
Anion gap: 11 (ref 5–15)
BUN: 13 mg/dL (ref 6–20)
CO2: 23 mmol/L (ref 22–32)
Calcium: 8.9 mg/dL (ref 8.9–10.3)
Chloride: 104 mmol/L (ref 98–111)
Creatinine, Ser: 0.61 mg/dL (ref 0.44–1.00)
GFR, Estimated: >60 mL/min (ref >60)
Glucose, Bld: 89 mg/dL (ref 70–99)
Potassium: 3.8 mmol/L (ref 3.5–5.1)
Sodium: 138 mmol/L (ref 135–145)
Total Bilirubin: 0.7 mg/dL (ref 0.3–1.2)
Total Protein: 8 g/dL (ref 6.5–8.1)

## 2022-02-11 LAB — URINALYSIS, COMPLETE (UACMP) WITH MICROSCOPIC
Bilirubin Urine: NEGATIVE
Glucose, UA: NEGATIVE mg/dL
Ketones, ur: NEGATIVE mg/dL
Leukocytes,Ua: NEGATIVE
Nitrite: NEGATIVE
Protein, ur: NEGATIVE mg/dL
Specific Gravity, Urine: 1.01 (ref 1.005–1.030)
pH: 5 (ref 5.0–8.0)

## 2022-02-11 LAB — POC URINE PREG, ED: Preg Test, Ur: NEGATIVE

## 2022-02-11 LAB — RESP PANEL BY RT-PCR (FLU A&B, COVID) ARPGX2
Influenza A by PCR: NEGATIVE
Influenza B by PCR: NEGATIVE
SARS Coronavirus 2 by RT PCR: NEGATIVE

## 2022-02-11 LAB — CBC
HCT: 42.6 % (ref 36.0–46.0)
Hemoglobin: 13.6 g/dL (ref 12.0–15.0)
MCH: 29.6 pg (ref 26.0–34.0)
MCHC: 31.9 g/dL (ref 30.0–36.0)
MCV: 92.8 fL (ref 80.0–100.0)
Platelets: 313 10*3/uL (ref 150–400)
RBC: 4.59 MIL/uL (ref 3.87–5.11)
RDW: 12.7 % (ref 11.5–15.5)
WBC: 7.3 10*3/uL (ref 4.0–10.5)
nRBC: 0 % (ref 0.0–0.2)

## 2022-02-11 MED ORDER — LACTATED RINGERS IV BOLUS
1000.0000 mL | Freq: Once | INTRAVENOUS | Status: AC
  Administered 2022-02-11: 1000 mL via INTRAVENOUS

## 2022-02-11 MED ORDER — ONDANSETRON 4 MG PO TBDP: 4.0000 mg | ORAL_TABLET | Freq: Three times a day (TID) | ORAL | 0 refills | Status: AC | PRN

## 2022-02-11 MED ORDER — KETOROLAC TROMETHAMINE 30 MG/ML IJ SOLN
15.0000 mg | Freq: Once | INTRAMUSCULAR | Status: AC
  Administered 2022-02-11: 15 mg via INTRAVENOUS
  Filled 2022-02-11: qty 1

## 2022-02-11 NOTE — ED Triage Notes (Signed)
Pt in with co generalized weakness and fatigue for a week. STates started Thursday and states yesterday she had syncopal episode. Denies any injury, denies any hx of the same.  ?

## 2022-02-11 NOTE — ED Provider Notes (Signed)
? ?[facility] ?Provider Note ? ? ? Event Date/Time  ? First MD Initiated Contact with Patient 02/11/22 1248   ?  (approximate) ? ? ?History  ? ?Headache and Loss of Consciousness ? ? ?HPI ? ?Rhonda Berry is a 36 y.o. female who presents to the ED for evaluation of Headache and Loss of Consciousness ?  ?Patient presents to the ED for evaluation of generalized weakness, headache and a syncopal episode that occurred yesterday.  She reports starting to feel bad about 4 days ago with generalized weakness and sleeping more throughout the day.  Denies any focal features.  Reports developing a headache in the past 2 days with poor appetite and poor energy level.  She reports standing yesterday from supine positioning, feeling dizzy and syncopizing.  No reported seizure activity or significant trauma.  No subsequent syncopal episodes.  Denies any chest pain.  Reports feeling weak and unwell with continued poor appetite.  Has not taken any medications for her headache. ? ?Spanish interpreter utilized for history and physical ? ? ?Physical Exam  ? ?Triage Vital Signs: ?ED Triage Vitals  ?Enc Vitals Group  ?   BP 02/11/22 1235 121/84  ?   Pulse Rate 02/11/22 1235 66  ?   Resp 02/11/22 1235 20  ?   Temp 02/11/22 1235 98.2 ?F (36.8 ?C)  ?   Temp Source 02/11/22 1235 Oral  ?   SpO2 02/11/22 1235 100 %  ?   Weight 02/11/22 1236 165 lb (74.8 kg)  ?   Height 02/11/22 1236 5\' 2"  (1.575 m)  ?   Head Circumference --   ?   Peak Flow --   ?   Pain Score 02/11/22 1236 8  ?   Pain Loc --   ?   Pain Edu? --   ?   Excl. in [place]? --   ? ? ?Most recent vital signs: ?Vitals:  ? 02/11/22 1235  ?BP: 121/84  ?Pulse: 66  ?Resp: 20  ?Temp: 98.2 ?F (36.8 ?C)  ?SpO2: 100%  ? ? ?General: Awake, no distress.  Obese.  Pleasant and conversational.  Appears well.  Stands independently and ambulates with a normal gait.  Denies any symptoms upon standing.  Just reports a headache and feeling weak. ?CV:  Good peripheral perfusion.  RRR ?Resp:  Normal effort. CTAB ?Abd:  No distention. Soft and benign ?MSK:  No deformity noted.  ?Neuro:  No focal deficits appreciated. Cranial nerves II through XII intact ?5/5 strength and sensation in all 4 extremities ?Other:   ? ? ?ED Results / Procedures / Treatments  ? ?Labs ?(all labs ordered are listed, but only abnormal results are displayed) ?Labs Reviewed  ?URINALYSIS, COMPLETE (UACMP) WITH MICROSCOPIC - Abnormal; Notable for the following components:  ?    Result Value  ? Color, Urine YELLOW (*)   ? APPearance CLEAR (*)   ? Hgb urine dipstick LARGE (*)   ? Bacteria, UA RARE (*)   ? All other components within normal limits  ?RESP PANEL BY RT-PCR (FLU A&B, COVID) [id number]  ?CBC  ?COMPREHENSIVE METABOLIC PANEL  ?POC URINE PREG, ED  ?TROPONIN I (HIGH SENSITIVITY)  ?TROPONIN I (HIGH SENSITIVITY)  ? ? ?EKG ?Sinus rhythm, rate of 60 bpm.  Normal axis and intervals.  No evidence of acute ischemia. ? ?RADIOLOGY ? ? ?Official radiology report(s): ?No results found. ? ?PROCEDURES and INTERVENTIONS: ? ?.1-3 Lead EKG Interpretation ?Performed by: Vladimir Crofts, MD ?Authorized by: Vladimir Crofts, MD  ? ?  Interpretation: normal   ?  ECG rate:  62 ?  ECG rate assessment: normal   ?  Rhythm: sinus rhythm   ?  Ectopy: none   ?  Conduction: normal   ? ?Medications  ?lactated ringers bolus 1,000 mL (1,000 mLs Intravenous New Bag/Given 02/11/22 1356)  ?ketorolac (TORADOL) 30 MG/ML injection 15 mg (15 mg Intravenous Given 02/11/22 1400)  ? ? ? ?IMPRESSION / MDM / ASSESSMENT AND PLAN / ED COURSE  ?I reviewed the triage vital signs and the nursing notes. ? ?36 year old female presents to the ED with headache, generalized weakness and a syncopal episode, likely a viral syndrome causing vasovagal episode and ultimately suitable for outpatient management.  She has normal vitals on room air and reassuring examination without evidence of trauma, neurologic or vascular deficits.  He is ambulatory with normal gait without symptoms.   Urine is clear, normal CBC and CMP.  Negative troponin and COVID/flu testing.  She is not pregnant.  Considering her vague symptoms of poor appetite, generalized weakness and sleeping, I suspect a viral syndrome.  Testing negative for flu and COVID.  After IV fluids and Toradol she has resolution of her headache and other symptoms.  Ambulatory and tolerating p.o. intake.  I considered medical observation admission due to syncope, but her syndrome is most consistent with vasovagal and her EKG is reassuring without evidence of cardiogenic syncope.  No dysrhythmia on the monitor.  We will discharge with return precautions. ? ?Clinical Course as of 02/11/22 1452  ?Sun Feb 11, 2022  ?1447 Reassessed.  Patient ports feeling a lot better.  We discussed the possibility of a viral syndrome considering her vague symptoms. [DS]  ?  ?Clinical Course User Index ?[DS] Vladimir Crofts, MD  ? ? ? ?FINAL CLINICAL IMPRESSION(S) / ED DIAGNOSES  ? ?Final diagnoses:  ?Syncope and collapse  ?Acute viral syndrome  ? ? ? ?Rx / DC Orders  ? ?ED Discharge Orders   ? ? None  ? ?  ? ? ? ?Note:  This document was prepared using Dragon voice recognition software and may include unintentional dictation errors. ?  ?Vladimir Crofts, MD ?02/11/22 1453 ? ?

## 2022-02-11 NOTE — Discharge Instructions (Addendum)
Please take Tylenol and ibuprofen/Advil for your pain.  It is safe to take them together, or to alternate them every few hours.  Take up to 1000mg  of Tylenol at a time, up to 4 times per day.  Do not take more than 4000 mg of Tylenol in 24 hours.  For ibuprofen, take 400-600 mg, 4-5 times per day. ? ?Use Zofran for any nausea or vomiting. ? ?If you have any other episodes of passing out, particularly with chest pain, seizure activity or severe headache then please return to the ED. ?
